# Patient Record
Sex: Male | Born: 1980 | Hispanic: Yes | Marital: Married | Smoking: Current some day smoker
Health system: Southern US, Community
[De-identification: ages and names within clinical notes are randomized; demographics above are authoritative.]

## PROBLEM LIST (undated history)

## (undated) DIAGNOSIS — IMO0001 Reserved for inherently not codable concepts without codable children: Secondary | ICD-10-CM

## (undated) DIAGNOSIS — K219 Gastro-esophageal reflux disease without esophagitis: Secondary | ICD-10-CM

---

## 2014-06-07 ENCOUNTER — Encounter: Payer: Self-pay | Admitting: *Deleted

## 2014-06-07 ENCOUNTER — Emergency Department (INDEPENDENT_AMBULATORY_CARE_PROVIDER_SITE_OTHER)
Admission: EM | Admit: 2014-06-07 | Discharge: 2014-06-07 | Disposition: A | Discharge status: Home / Self Care | Payer: BLUE CROSS/BLUE SHIELD | Source: Home / Self Care | Attending: Emergency Medicine | Admitting: Emergency Medicine

## 2014-06-07 DIAGNOSIS — S39012A Strain of muscle, fascia and tendon of lower back, initial encounter: Secondary | ICD-10-CM

## 2014-06-07 HISTORY — DX: Gastro-esophageal reflux disease without esophagitis: K21.9

## 2014-06-07 HISTORY — DX: Reserved for inherently not codable concepts without codable children: IMO0001

## 2014-06-07 MED ORDER — KETOROLAC TROMETHAMINE 60 MG/2ML IM SOLN
60.0000 mg | Freq: Once | INTRAMUSCULAR | Status: AC
  Administered 2014-06-07: 60 mg via INTRAMUSCULAR

## 2014-06-07 MED ORDER — PANTOPRAZOLE SODIUM 20 MG PO TBEC: 20.0000 mg | DELAYED_RELEASE_TABLET | Freq: Every day | ORAL | Status: DC

## 2014-06-07 MED ORDER — METHOCARBAMOL 500 MG PO TABS: 500.0000 mg | ORAL_TABLET | Freq: Two times a day (BID) | ORAL | Status: DC

## 2014-06-07 MED ORDER — HYDROCODONE-ACETAMINOPHEN 5-325 MG PO TABS: 2.0000 | ORAL_TABLET | ORAL | Status: DC | PRN

## 2014-06-07 MED ORDER — IBUPROFEN 800 MG PO TABS: 800.0000 mg | ORAL_TABLET | Freq: Three times a day (TID) | ORAL | Status: DC
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
## 2014-06-14 ENCOUNTER — Ambulatory Visit: Payer: Self-pay | Admitting: Physician Assistant

## 2014-07-07 ENCOUNTER — Encounter: Payer: Self-pay | Admitting: Emergency Medicine

## 2014-07-07 ENCOUNTER — Emergency Department (INDEPENDENT_AMBULATORY_CARE_PROVIDER_SITE_OTHER)
Admission: EM | Admit: 2014-07-07 | Discharge: 2014-07-07 | Disposition: A | Discharge status: Home / Self Care | Payer: BLUE CROSS/BLUE SHIELD | Source: Home / Self Care | Attending: Emergency Medicine | Admitting: Emergency Medicine

## 2014-07-07 DIAGNOSIS — S39012D Strain of muscle, fascia and tendon of lower back, subsequent encounter: Secondary | ICD-10-CM

## 2014-07-07 MED ORDER — METHOCARBAMOL 500 MG PO TABS: 500.0000 mg | ORAL_TABLET | Freq: Two times a day (BID) | ORAL | Status: DC

## 2014-07-07 MED ORDER — IBUPROFEN 800 MG PO TABS: 800.0000 mg | ORAL_TABLET | Freq: Three times a day (TID) | ORAL | Status: DC | PRN
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
## 2014-07-20 ENCOUNTER — Encounter: Payer: Self-pay | Admitting: *Deleted

## 2014-07-20 ENCOUNTER — Emergency Department (INDEPENDENT_AMBULATORY_CARE_PROVIDER_SITE_OTHER)
Admission: EM | Admit: 2014-07-20 | Discharge: 2014-07-20 | Disposition: A | Discharge status: Home / Self Care | Payer: BLUE CROSS/BLUE SHIELD | Source: Home / Self Care | Attending: Family Medicine | Admitting: Family Medicine

## 2014-07-20 ENCOUNTER — Emergency Department (INDEPENDENT_AMBULATORY_CARE_PROVIDER_SITE_OTHER): Payer: BLUE CROSS/BLUE SHIELD

## 2014-07-20 DIAGNOSIS — M76892 Other specified enthesopathies of left lower limb, excluding foot: Secondary | ICD-10-CM

## 2014-07-20 DIAGNOSIS — M25552 Pain in left hip: Secondary | ICD-10-CM

## 2014-07-20 DIAGNOSIS — M25551 Pain in right hip: Secondary | ICD-10-CM | POA: Diagnosis not present

## 2014-07-20 MED ORDER — PREDNISONE 20 MG PO TABS: 20.0000 mg | ORAL_TABLET | Freq: Two times a day (BID) | ORAL | Status: DC
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
## 2014-07-20 NOTE — Discharge Instructions (Signed)
May continue Ibuprofen after finishing prednisone.

## 2014-07-22 ENCOUNTER — Encounter: Payer: Self-pay | Admitting: Sports Medicine

## 2014-07-22 ENCOUNTER — Ambulatory Visit (INDEPENDENT_AMBULATORY_CARE_PROVIDER_SITE_OTHER): Payer: BLUE CROSS/BLUE SHIELD | Admitting: Sports Medicine

## 2014-07-22 ENCOUNTER — Ambulatory Visit (INDEPENDENT_AMBULATORY_CARE_PROVIDER_SITE_OTHER): Payer: BLUE CROSS/BLUE SHIELD

## 2014-07-22 VITALS — BP 107/67 | HR 72 | Ht 71.0 in | Wt 196.0 lb

## 2014-07-22 DIAGNOSIS — M791 Myalgia: Secondary | ICD-10-CM | POA: Diagnosis not present

## 2014-07-22 DIAGNOSIS — M545 Low back pain, unspecified: Secondary | ICD-10-CM | POA: Insufficient documentation

## 2014-07-22 DIAGNOSIS — IMO0001 Reserved for inherently not codable concepts without codable children: Secondary | ICD-10-CM

## 2014-07-22 DIAGNOSIS — M609 Myositis, unspecified: Secondary | ICD-10-CM

## 2014-07-22 MED ORDER — MELOXICAM 15 MG PO TABS: ORAL_TABLET | ORAL | Status: DC

## 2014-07-22 NOTE — Assessment & Plan Note (Signed)
Doing well with prednisone prescribed by urgent care. X-rays do show what appear to be a left-sided L5-S1 pseudoarthrosis. Dedicated lumbar spine x-rays, physical therapy, meloxicam. Trigger point injection given today. He does have some night sweats and we are going to obtain a sedimentation rate. Return in one month.

## 2014-07-22 NOTE — Progress Notes (Signed)
   Subjective:    I'm seeing this patient as a consultation for:  Dr. 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 No bowel or bladder dysfunction, no saddle numbness, he does get some night sweats.  Past medical history, Surgical history, Family history not pertinant except as noted below, Social history, Allergies, and medications have been entered into the medical record, reviewed, and no changes needed.   Review of Systems: No headache, visual changes, nausea, vomiting, diarrhea, constipation, dizziness, abdominal pain, skin rash, fevers, chills, night sweats, weight loss, swollen lymph nodes, body aches, joint swelling, muscle aches, chest pain, shortness of breath, mood changes, visual or auditory hallucinations.   Objective:   General: Well Developed, well nourished, and in no acute distress.  Neuro/Psych: Alert and oriented x3, extra-ocular muscles intact, able to move all 4 extremities, sensation grossly intact. Skin: Warm and dry, no rashes noted.  Respiratory: Not using accessory muscles, speaking in full sentences, trachea midline.  Cardiovascular: Pulses palpable, no extremity edema. Abdomen: Does not appear distended. Back Exam:  Inspection: Unremarkable  Motion: Flexion 45 deg, Extension 45 deg, Side Bending to 45 deg bilaterally,  Rotation to 45 deg bilaterally  SLR laying: Negative  XSLR laying: Negative  Palpable tenderness: Left paraspinal muscles. FABER: negative. Sensory change: Gross sensation intact to all lumbar and sacral dermatomes.  Reflexes: 2+ at both  patellar tendons, 2+ at achilles tendons, Babinski's downgoing.  Strength at foot  Plantar-flexion: 5/5 Dorsi-flexion: 5/5 Eversion: 5/5 Inversion: 5/5  Leg strength  Quad: 5/5 Hamstring: 5/5 Hip flexor: 5/5 Hip abductors: 5/5  Gait unremarkable.  X-rays reviewed and shows pseudoarthrosis at the L5-S1 level, on his dedicated lumbar films I am unable to make out the pseudoarthrosis however I do not see any significant degenerative disc disease.  Procedure:  Injection of left paralumbar trigger point Consent obtained and verified. Time-out conducted. Noted no overlying erythema, induration, or other signs of local infection. Skin prepped in a sterile fashion. Topical analgesic spray: Ethyl chloride. Completed without difficulty. Meds: A total of 1 mL Kenalog 40, 1 mL lidocaine injected in a fanlike pattern. Pain immediately improved suggesting accurate placement of the medication. Advised to call if fevers/chills, erythema, induration, drainage, or persistent bleeding.   Impression and Recommendations:   This case required medical decision making of moderate complexity.

## 2014-07-23 LAB — SEDIMENTATION RATE: Sed Rate: 1 mm/h (ref 0–15)

## 2014-07-24 LAB — QUANTIFERON TB GOLD ASSAY (BLOOD)
Interferon Gamma Release Assay: NEGATIVE
Mitogen value: 5.71 [IU]/mL
Quantiferon Nil Value: 0.05 IU/mL
Quantiferon Tb Ag Minus Nil Value: 0.01 [IU]/mL
TB Ag value: 0.06 [IU]/mL

## 2014-08-23 ENCOUNTER — Telehealth: Payer: Self-pay

## 2014-08-23 DIAGNOSIS — M545 Low back pain: Secondary | ICD-10-CM

## 2014-08-23 MED ORDER — PREDNISONE 10 MG (21) PO TBPK: ORAL_TABLET | ORAL | Status: DC

## 2014-08-23 NOTE — Telephone Encounter (Signed)
Patient is still having back pain. I scheduled him a follow up appointment for next week. He would like another round of prednisone for the pain.

## 2014-08-23 NOTE — Telephone Encounter (Signed)
Patient advised.

## 2014-08-23 NOTE — Telephone Encounter (Signed)
Another course of prednisone has been sent in, this time to taper, also ordered an MRI for interventional planning. Patient can come back to see me to go over MRI results once the MRI is done.

## 2014-08-30 ENCOUNTER — Ambulatory Visit: Payer: BLUE CROSS/BLUE SHIELD | Admitting: Sports Medicine

## 2015-03-19 ENCOUNTER — Other Ambulatory Visit: Payer: Self-pay | Admitting: Sports Medicine

## 2016-01-04 ENCOUNTER — Ambulatory Visit (INDEPENDENT_AMBULATORY_CARE_PROVIDER_SITE_OTHER): Payer: 59 | Admitting: Osteopathic Medicine

## 2016-01-04 ENCOUNTER — Encounter: Payer: Self-pay | Admitting: Osteopathic Medicine

## 2016-01-04 VITALS — BP 111/73 | HR 57 | Ht 71.0 in | Wt 197.0 lb

## 2016-01-04 DIAGNOSIS — R233 Spontaneous ecchymoses: Secondary | ICD-10-CM | POA: Insufficient documentation

## 2016-01-04 DIAGNOSIS — Z23 Encounter for immunization: Secondary | ICD-10-CM

## 2016-01-04 DIAGNOSIS — Z0000 Encounter for general adult medical examination without abnormal findings: Secondary | ICD-10-CM | POA: Diagnosis not present

## 2016-01-04 DIAGNOSIS — M545 Low back pain, unspecified: Secondary | ICD-10-CM

## 2016-01-04 DIAGNOSIS — K219 Gastro-esophageal reflux disease without esophagitis: Secondary | ICD-10-CM | POA: Diagnosis not present

## 2016-01-04 DIAGNOSIS — R238 Other skin changes: Secondary | ICD-10-CM

## 2016-01-04 MED ORDER — OMEPRAZOLE 40 MG PO CPDR: 40.0000 mg | DELAYED_RELEASE_CAPSULE | Freq: Every day | ORAL | 0 refills | Status: DC

## 2016-01-04 MED ORDER — METHYLPREDNISOLONE ACETATE 80 MG/ML IJ SUSP
80.0000 mg | Freq: Once | INTRAMUSCULAR | Status: AC
  Administered 2016-01-04: 80 mg via INTRAMUSCULAR

## 2016-01-04 MED ORDER — MELOXICAM 15 MG PO TABS: ORAL_TABLET | ORAL | 0 refills | Status: DC

## 2016-01-04 NOTE — Addendum Note (Signed)
Addended by: Willey BladeUNNINGHAM, Claxton Levitz C on: 01/04/2016 01:01 PM   Modules accepted: Orders

## 2016-01-04 NOTE — Progress Notes (Signed)
HPI: Anthony Lowery is a 35 y.o. male  who presents to [facility] today, 01/04/16,  for chief complaint of:  Chief Complaint  Patient presents with  . Establish Care    "Yearly physical/low back pain/easily bruise/frequent heartburn"    *Low back pain:  . Context: no injury . Modifying factors: trigger point injection was helpful last year. Seeing chirppractor . Assoc signs/symptoms: no leg numbness Records reviewed: Patient saw Dr. Karie Schwalbe for low back pain April 2016, injection of left paralumbar trigger point was performed. X-ray review noted pseudo-arthritis at the L5-S1 level but this was not visible on dedicated lumbar films by Dr. Karie Schwalbe. No significant degenerative disc disease. Physical therapy and meloxicam was prescribed. Instructed to return in 1 month, patient did not follow up. Sedimentation rate and QuantiFERON were checked and negative.  *GERD - taking generic version of Zantac has bene helpful. Usually taking this every day. Worse with food, especially tomato sauce. No dietary. No drastic weight loss.   *EASY BRUISING - ongoing past few years, no bleeding from gums, no blood in stool, no epistaxis   See below for review of preventive care     Past medical, surgical, social and family history reviewed: Past Medical History:  Diagnosis Date  . Reflux    History reviewed. No pertinent surgical history. Social History  Substance Use Topics  . Smoking status: Current Some Day Smoker    Packs/day: 0.00    Types: Cigarettes  . Smokeless tobacco: Never Used  . Alcohol use Yes   Family History  Problem Relation Age of Onset  . Hypertension Mother   . Diabetes Mother   . Diabetes Father   . Hypertension Father      Current medication list and allergy/intolerance information reviewed:   Current Outpatient Prescriptions  Medication Sig Dispense Refill  . meloxicam (MOBIC) 15 MG tablet TAKE 1 TABLET BY MOUTH EVERY MORNING WITH BREAKFAST  FOR 2 WEEKS, THEN EVERY DAY AS NEEDED FOR PAIN 30 tablet 0   No current facility-administered medications for this visit.    No Known Allergies    Review of Systems:  Constitutional:  No  fever, no chills, No recent illness, (+) weight gain unintentional weight changes. No significant fatigue.   HEENT: No  headache, no vision change, no hearing change, No sore throat, No  sinus pressure  Cardiac: No  chest pain, No  pressure, No palpitations, No  Orthopnea  Respiratory:  No  shortness of breath. No  Cough  Gastrointestinal: No  abdominal pain, No  nausea, No  vomiting,  No  blood in stool, No  diarrhea, No  Constipation, (+) GERD   Musculoskeletal: (+)new myalgia/arthralgia  Genitourinary: No  incontinence, No  abnormal genital bleeding, No abnormal genital discharge  Skin: No  Rash, No other wounds/concerning lesions  Hem/Onc: (+) easy bruising, no easy bleeding, No  abnormal lymph node  Endocrine: No cold intolerance,  No heat intolerance. No polyuria/polydipsia/polyphagia   Neurologic: No  weakness, No  dizziness, No  slurred speech/focal weakness/facial droop  Psychiatric: No  concerns with depression, No  concerns with anxiety, No sleep problems, No mood problems  Exam:  BP 111/73   Pulse (!) 57   Ht 5\' 11"  (1.803 m)   Wt 197 lb (89.4 kg)   BMI 27.48 kg/m   Constitutional: VS see above. General Appearance: alert, well-developed, well-nourished, NAD  Eyes: Normal lids and conjunctive, non-icteric sclera  Ears, Nose, Mouth, Throat: MMM, Normal external inspection  ears/nares/mouth/lips/gums. TM normal bilaterally. Pharynx/tonsils no erythema, no exudate. Nasal mucosa normal.   Neck: No masses, trachea midline. No thyroid enlargement. No tenderness/mass appreciated. No lymphadenopathy  Respiratory: Normal respiratory effort. no wheeze, no rhonchi, no rales  Cardiovascular: S1/S2 normal, no murmur, no rub/gallop auscultated. RRR. No lower extremity edema.    Gastrointestinal: Nontender, no masses. No hepatomegaly, no splenomegaly. No hernia appreciated. Bowel sounds normal. Rectal exam deferred.   Musculoskeletal: Gait normal. No clubbing/cyanosis of digits. Paralumbar tenderness bilaterally, negative straight leg raise bilaterally.  Neurological:Normal balance/coordination. No tremor.   Skin: warm, dry, intact.   Psychiatric: Normal judgment/insight. Normal mood and affect. Oriented x3.      ASSESSMENT/PLAN:   Annual physical exam - Plan: CBC with Differential/Platelet, COMPLETE METABOLIC PANEL WITH GFR, Lipid panel  *Gastroesophageal reflux disease, esophagitis presence not specified - Plan: omeprazole (PRILOSEC) 40 MG capsule  *Easy bruising - Plan: Protime-INR, PTT  *Bilateral low back pain, without sciatica   MALE PREVENTIVE CARE updated 01/04/16  ANNUAL SCREENING/COUNSELING  Any changes to health in the past year? no  Tobacco - 1/2 pack per week   Alcohol - 6 drinks per week  Diet/Exercise - Healthy habits discussed to decrease CV risk and promote overall health. Patient does not have dietary restrictions.   Depression - PQH2 Negative  Feel safe at home? - yes  HTN SCREENING - SEE VITALS  SEXUAL/REPRODUCTIVE HEALTH  Sexually active? - Yes with male.  STI testing needed/desired today? - no  INFECTIOUS DISEASE SCREENING  HIV - needs  GC/CT - needs  HepC - does not need  TB - does not need  CANCER SCREENING  Lung - does not need  Colon - does not need  Prostate - does not need  OTHER DISEASE SCREENING  Lipid - needs  DM2 - needs  Osteoporosis - does not need  ADULT VACCINATION  Influenza - needs today but declined, annual vaccine recommended  Td - needs today  Zoster - was not indicated  Pneumonia - was not indicated    Patient Instructions  Plan:  For acid reflux, we are going to do 8 weeks of medication called omeprazole, after this transition back to the generic Zantac. If  your heartburn returns or gets worse, at that point we will need to send to GI for a scope to evaluate for possible ulcers. See below for other information about acid reflux and ways to prevent this problem.  For back pain, anti-inflammatory medication was prescribed and shot of steroids given in office. Okay to continue with chiropractor. If this issue continues to cause you problems, would recommend follow-up with Dr. Karie Schwalbe for repeat injection or physical therapy. See the andout you were given for home exercises as well to help with back pain.  For easy bruising, we will get lab work.   Other lab work for annual physical/routine screening.   She did receive a call about your lab test results within the next few days.  Please note, you requested an annual physical today but we addressed other medical problems in addition to preventive care, you may be billed by your insurance for this - any questions about your bill please call [facility] billing department, and our office will do all we can to help if there are any issues or problems.     Visit summary with medication list and pertinent instructions was printed for patient to review. All questions at time of visit were answered - patient instructed to contact office with any additional  concerns. ER/RTC precautions were reviewed with the patient. Follow-up plan: Return in about 8 weeks (around 02/29/2016) for Follow-up on back pain and acid reflux.   *separately billed problem-based visit

## 2016-01-04 NOTE — Patient Instructions (Addendum)
Plan:  For acid reflux, we are going to do 8 weeks of medication called omeprazole, after this transition back to the generic Zantac. If your heartburn returns or gets worse, at that point we will need to send to GI for a scope to evaluate for possible ulcers. See below for other information about acid reflux and ways to prevent this problem.  For back pain, anti-inflammatory medication was prescribed and shot of steroids given in office. Okay to continue with chiropractor. If this issue continues to cause you problems, would recommend follow-up with Dr. Karie Schwalbe for repeat injection or physical therapy. See the andout you were given for home exercises as well to help with back pain.  For easy bruising, we will get lab work.   Other lab work for annual physical/routine screening.   She did receive a call about your lab test results within the next few days.  Please note, you requested an annual physical today but we addressed other medical problems in addition to preventive care, you may be billed by your insurance for this - any questions about your bill please call [facility] billing department, and our office will do all we can to help if there are any issues or problems.     Gastroesophageal Reflux Disease, Adult Normally, food travels down the esophagus and stays in the stomach to be digested. However, when a person has gastroesophageal reflux disease (GERD), food and stomach acid move back up into the esophagus. When this happens, the esophagus becomes sore and inflamed. Over time, GERD can create small holes (ulcers) in the lining of the esophagus.  CAUSES This condition is caused by a problem with the muscle between the esophagus and the stomach (lower esophageal sphincter, or LES). Normally, the LES muscle closes after food passes through the esophagus to the stomach. When the LES is weakened or abnormal, it does not close properly, and that allows food and stomach acid to go back up into the  esophagus. The LES can be weakened by certain dietary substances, medicines, and medical conditions, including:  Tobacco use.  Pregnancy.  Having a hiatal hernia.  Heavy alcohol use.  Certain foods and beverages, such as coffee, chocolate, onions, and peppermint. RISK FACTORS This condition is more likely to develop in:  People who have an increased body weight.  People who have connective tissue disorders.  People who use NSAID medicines. SYMPTOMS Symptoms of this condition include:  Heartburn.  Difficult or painful swallowing.  The feeling of having a lump in the throat.  Abitter taste in the mouth.  Bad breath.  Having a large amount of saliva.  Having an upset or bloated stomach.  Belching.  Chest pain.  Shortness of breath or wheezing.  Ongoing (chronic) cough or a night-time cough.  Wearing away of tooth enamel.  Weight loss. Different conditions can cause chest pain. Make sure to see your health care provider if you experience chest pain. DIAGNOSIS Your health care provider will take a medical history and perform a physical exam. To determine if you have mild or severe GERD, your health care provider may also monitor how you respond to treatment. You may also have other tests, including:  An endoscopy toexamine your stomach and esophagus with a small camera.  A test thatmeasures the acidity level in your esophagus.  A test thatmeasures how much pressure is on your esophagus.  A barium swallow or modified barium swallow to show the shape, size, and functioning of your esophagus. TREATMENT The goal  of treatment is to help relieve your symptoms and to prevent complications. Treatment for this condition may vary depending on how severe your symptoms are. Your health care provider may recommend:  Changes to your diet.  Medicine.  Surgery. HOME CARE INSTRUCTIONS Diet  Follow a diet as recommended by your health care provider. This may involve  avoiding foods and drinks such as:  Coffee and tea (with or without caffeine).  Drinks that containalcohol.  Energy drinks and sports drinks.  Carbonated drinks or sodas.  Chocolate and cocoa.  Peppermint and mint flavorings.  Garlic and onions.  Horseradish.  Spicy and acidic foods, including peppers, chili powder, curry powder, vinegar, hot sauces, and barbecue sauce.  Citrus fruit juices and citrus fruits, such as oranges, lemons, and limes.  Tomato-based foods, such as red sauce, chili, salsa, and pizza with red sauce.  Fried and fatty foods, such as donuts, french fries, potato chips, and high-fat dressings.  High-fat meats, such as hot dogs and fatty cuts of red and white meats, such as rib eye steak, sausage, ham, and bacon.  High-fat dairy items, such as whole milk, butter, and cream cheese.  Eat small, frequent meals instead of large meals.  Avoid drinking large amounts of liquid with your meals.  Avoid eating meals during the 2-3 hours before bedtime.  Avoid lying down right after you eat.  Do not exercise right after you eat. General Instructions  Pay attention to any changes in your symptoms.  Take over-the-counter and prescription medicines only as told by your health care provider. Do not take aspirin, ibuprofen, or other NSAIDs unless your health care provider told you to do so.  Do not use any tobacco products, including cigarettes, chewing tobacco, and e-cigarettes. If you need help quitting, ask your health care provider.  Wear loose-fitting clothing. Do not wear anything tight around your waist that causes pressure on your abdomen.  Raise (elevate) the head of your bed 6 inches (15cm).  Try to reduce your stress, such as with yoga or meditation. If you need help reducing stress, ask your health care provider.  If you are overweight, reduce your weight to an amount that is healthy for you. Ask your health care provider for guidance about a  safe weight loss goal.  Keep all follow-up visits as told by your health care provider. This is important. SEEK MEDICAL CARE IF:  You have new symptoms.  You have unexplained weight loss.  You have difficulty swallowing, or it hurts to swallow.  You have wheezing or a persistent cough.  Your symptoms do not improve with treatment.  You have a hoarse voice. SEEK IMMEDIATE MEDICAL CARE IF:  You have pain in your arms, neck, jaw, teeth, or back.  You feel sweaty, dizzy, or light-headed.  You have chest pain or shortness of breath.  You vomit and your vomit looks like blood or coffee grounds.  You faint.  Your stool is bloody or black.  You cannot swallow, drink, or eat.   This information is not intended to replace advice given to you by your health care provider. Make sure you discuss any questions you have with your health care provider.   Document Released: 01/16/2005 Document Revised: 12/28/2014 Document Reviewed: 08/03/2014 Elsevier Interactive Patient Education [employer]2016 [employer]

## 2016-01-05 LAB — LIPID PANEL
CHOL/HDL RATIO: 5.9 ratio — AB (ref <=5.0)
CHOLESTEROL: 225 mg/dL — AB (ref 125–200)
HDL: 38 mg/dL — ABNORMAL LOW (ref >=40)
LDL Cholesterol: 122 mg/dL (ref <130)
Triglycerides: 324 mg/dL — ABNORMAL HIGH (ref <150)
VLDL: 65 mg/dL — ABNORMAL HIGH (ref <30)

## 2016-01-05 LAB — PROTIME-INR
INR: 1
Prothrombin Time: 10.8 s (ref 9.0–11.5)

## 2016-01-05 LAB — COMPLETE METABOLIC PANEL WITH GFR
ALT: 26 U/L (ref 9–46)
AST: 18 U/L (ref 10–40)
Albumin: 4.6 g/dL (ref 3.6–5.1)
Alkaline Phosphatase: 45 U/L (ref 40–115)
BUN: 18 mg/dL (ref 7–25)
CALCIUM: 9.1 mg/dL (ref 8.6–10.3)
CHLORIDE: 103 mmol/L (ref 98–110)
CO2: 27 mmol/L (ref 20–31)
CREATININE: 0.91 mg/dL (ref 0.60–1.35)
GFR, Est African American: >89 mL/min (ref >=60)
GFR, Est Non African American: >89 mL/min (ref >=60)
Glucose, Bld: 83 mg/dL (ref 65–99)
POTASSIUM: 4 mmol/L (ref 3.5–5.3)
Sodium: 141 mmol/L (ref 135–146)
Total Bilirubin: 0.8 mg/dL (ref 0.2–1.2)
Total Protein: 7.2 g/dL (ref 6.1–8.1)

## 2016-01-05 LAB — CBC WITH DIFFERENTIAL/PLATELET
BASOS ABS: 88 {cells}/uL (ref 0–200)
Basophils Relative: 1 %
EOS ABS: 176 {cells}/uL (ref 15–500)
Eosinophils Relative: 2 %
HCT: 46.3 % (ref 38.5–50.0)
Hemoglobin: 16.1 g/dL (ref 13.2–17.1)
LYMPHS ABS: 3696 {cells}/uL (ref 850–3900)
LYMPHS PCT: 42 %
MCH: 31.2 pg (ref 27.0–33.0)
MCHC: 34.8 g/dL (ref 32.0–36.0)
MCV: 89.7 fL (ref 80.0–100.0)
MPV: 10.5 fL (ref 7.5–12.5)
Monocytes Absolute: 616 cells/uL (ref 200–950)
Monocytes Relative: 7 %
NEUTROS PCT: 48 %
Neutro Abs: 4224 cells/uL (ref 1500–7800)
Platelets: 210 10*3/uL (ref 140–400)
RBC: 5.16 MIL/uL (ref 4.20–5.80)
RDW: 13.4 % (ref 11.0–15.0)
WBC: 8.8 10*3/uL (ref 3.8–10.8)

## 2016-01-05 LAB — APTT: APTT: 30 s (ref 22–34)

## 2016-01-31 ENCOUNTER — Other Ambulatory Visit: Payer: Self-pay | Admitting: Osteopathic Medicine

## 2016-03-05 ENCOUNTER — Other Ambulatory Visit: Payer: Self-pay | Admitting: Osteopathic Medicine

## 2016-03-05 DIAGNOSIS — K219 Gastro-esophageal reflux disease without esophagitis: Secondary | ICD-10-CM

## 2016-03-07 ENCOUNTER — Ambulatory Visit: Payer: 59 | Admitting: Osteopathic Medicine
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
